# Patient Record
Sex: Male | Born: 1995 | Race: Black or African American | Hispanic: No | Marital: Single | Smoking: Never smoker
Health system: Southern US, Community
[De-identification: ages and names within clinical notes are randomized; demographics above are authoritative.]

## PROBLEM LIST (undated history)

## (undated) DIAGNOSIS — R0600 Dyspnea, unspecified: Secondary | ICD-10-CM

## (undated) DIAGNOSIS — R9431 Abnormal electrocardiogram [ECG] [EKG]: Secondary | ICD-10-CM

## (undated) DIAGNOSIS — R0609 Other forms of dyspnea: Secondary | ICD-10-CM

## (undated) DIAGNOSIS — R002 Palpitations: Secondary | ICD-10-CM

## (undated) DIAGNOSIS — R0789 Other chest pain: Secondary | ICD-10-CM

## (undated) HISTORY — DX: Other chest pain: R07.89

## (undated) HISTORY — PX: NO PAST SURGERIES: SHX2092

## (undated) HISTORY — DX: Dyspnea, unspecified: R06.00

## (undated) HISTORY — DX: Abnormal electrocardiogram (ECG) (EKG): R94.31

## (undated) HISTORY — DX: Other forms of dyspnea: R06.09

## (undated) HISTORY — DX: Palpitations: R00.2

---
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
## 2016-07-07 ENCOUNTER — Emergency Department (HOSPITAL_COMMUNITY)
Admission: EM | Admit: 2016-07-07 | Discharge: 2016-07-07 | Disposition: A | Discharge status: Home / Self Care | Payer: Managed Care, Other (non HMO) | Attending: Emergency Medicine | Admitting: Emergency Medicine

## 2016-07-07 ENCOUNTER — Encounter (HOSPITAL_COMMUNITY): Payer: Self-pay

## 2016-07-07 ENCOUNTER — Emergency Department (HOSPITAL_COMMUNITY): Payer: Managed Care, Other (non HMO)

## 2016-07-07 DIAGNOSIS — T7840XA Allergy, unspecified, initial encounter: Secondary | ICD-10-CM

## 2016-07-07 DIAGNOSIS — T781XXA Other adverse food reactions, not elsewhere classified, initial encounter: Secondary | ICD-10-CM | POA: Insufficient documentation

## 2016-07-07 DIAGNOSIS — R220 Localized swelling, mass and lump, head: Secondary | ICD-10-CM | POA: Diagnosis not present

## 2016-07-07 DIAGNOSIS — Z792 Long term (current) use of antibiotics: Secondary | ICD-10-CM | POA: Diagnosis not present

## 2016-07-07 LAB — CBC
HCT: 41.8 % (ref 39.0–52.0)
HEMOGLOBIN: 14 g/dL (ref 13.0–17.0)
MCH: 25.9 pg — ABNORMAL LOW (ref 26.0–34.0)
MCHC: 33.5 g/dL (ref 30.0–36.0)
MCV: 77.3 fL — ABNORMAL LOW (ref 78.0–100.0)
Platelets: 242 10*3/uL (ref 150–400)
RBC: 5.41 MIL/uL (ref 4.22–5.81)
RDW: 13.9 % (ref 11.5–15.5)
WBC: 7.7 10*3/uL (ref 4.0–10.5)

## 2016-07-07 LAB — BASIC METABOLIC PANEL
ANION GAP: 8 (ref 5–15)
BUN: 13 mg/dL (ref 6–20)
CALCIUM: 9.5 mg/dL (ref 8.9–10.3)
CO2: 24 mmol/L (ref 22–32)
Chloride: 108 mmol/L (ref 101–111)
Creatinine, Ser: 1.05 mg/dL (ref 0.61–1.24)
Glucose, Bld: 91 mg/dL (ref 65–99)
Potassium: 3.5 mmol/L (ref 3.5–5.1)
Sodium: 140 mmol/L (ref 135–145)

## 2016-07-07 LAB — I-STAT TROPONIN, ED: TROPONIN I, POC: 0 ng/mL (ref 0.00–0.08)

## 2016-07-07 MED ORDER — DIPHENHYDRAMINE HCL 50 MG/ML IJ SOLN
50.0000 mg | Freq: Once | INTRAMUSCULAR | Status: AC
  Administered 2016-07-07: 50 mg via INTRAVENOUS
  Filled 2016-07-07: qty 1

## 2016-07-07 MED ORDER — DIPHENHYDRAMINE HCL 25 MG PO TABS: 50.0000 mg | ORAL_TABLET | ORAL | 0 refills | Status: AC | PRN

## 2016-07-07 MED ORDER — PREDNISONE 20 MG PO TABS: ORAL_TABLET | ORAL | 0 refills | Status: DC

## 2016-07-07 MED ORDER — METHYLPREDNISOLONE SODIUM SUCC 125 MG IJ SOLR
125.0000 mg | Freq: Once | INTRAMUSCULAR | Status: AC
  Administered 2016-07-07: 125 mg via INTRAVENOUS
  Filled 2016-07-07: qty 2

## 2016-07-07 NOTE — ED Triage Notes (Addendum)
Pt states that he started having L sided chest pain tonight. Also endorses a burning pain in his throat. No SOB, but states that he did vomit on the way here. Pt endorses eat chinese food earlier today, but nothing new besides that. NKA. Pt not diaphoretic. Ambulatory.

## 2016-07-07 NOTE — ED Provider Notes (Signed)
[facility]-EMERGENCY DEPT Provider Note  By signing my name below, I, Jeffery Mendoza, attest that this documentation has been prepared under the direction and in the presence of Treatment Team:  Attending Provider: Gilda Crease, MD.  Electronically Signed: Arvilla Market, Medical Scribe. 07/07/16. 2:30 AM.  CSN: [id number] Arrival date & time: 07/07/16  0202  First Provider Contact:  None    History   Chief Complaint Chief Complaint  Patient presents with  . Chest Pain  . Allergic Reaction    HPI Jeffery Mendoza is a 20 y.o. male.  The history is provided by the patient. No language interpreter was used.  Chest Pain  Associated symptoms: chest pain, nausea and vomiting   Associated symptoms: no shortness of breath   Allergic Reaction  Presenting symptoms: difficulty swallowing   Presenting symptoms: no wheezing    HPI Comments: Jeffery Mendoza is a 20 y.o. male who presents to the Emergency Department complaining of throat swelling onset tonight.  Pt states it feels like his throat is half way closed. Pt was eating chineses food earlier tonight. Pt felt like something went down his throat when he was drinking water. He states that he is having associated symptoms of chest pain, nausea, and vomiting.  Pt reports dark emesis after his throat felt different. Pt denies toruble breathing, and having food allergies.  History reviewed. No pertinent past medical history.  There are no active problems to display for this patient.   History reviewed. No pertinent surgical history.     Home Medications    Prior to Admission medications   Not on File    Family History History reviewed. No pertinent family history.  Social History Social History  Substance Use Topics  . Smoking status: Never Smoker  . Smokeless tobacco: Not on file  . Alcohol use No     Allergies   Review of patient's allergies indicates no known allergies.   Review of Systems Review of Systems    HENT: Positive for trouble swallowing.   Respiratory: Negative for shortness of breath and wheezing.   Cardiovascular: Positive for chest pain.  Gastrointestinal: Positive for nausea and vomiting.  Allergic/Immunologic: Negative for food allergies.   Physical Exam Updated Vital Signs BP 149/99 (BP Location: Left Arm)   Pulse 76   Temp 98.9 F (37.2 C) (Oral)   Resp 20   SpO2 99%   Physical Exam  Constitutional: He is oriented to person, place, and time. He appears well-developed and well-nourished. No distress.  HENT:  Head: Normocephalic and atraumatic.  Right Ear: Hearing normal.  Left Ear: Hearing normal.  Nose: Nose normal.  Mouth/Throat: Oropharynx is clear and moist and mucous membranes are normal.  Eyes: Conjunctivae and EOM are normal. Pupils are equal, round, and reactive to light.  Neck: Normal range of motion. Neck supple.  Cardiovascular: Regular rhythm, S1 normal and S2 normal.  Exam reveals no gallop and no friction rub.   No murmur heard. Pulmonary/Chest: Effort normal and breath sounds normal. No respiratory distress. He exhibits no tenderness.  Abdominal: Soft. Normal appearance and bowel sounds are normal. There is no hepatosplenomegaly. There is no tenderness. There is no rebound, no guarding, no tenderness at McBurney's point and negative Murphy's sign. No hernia.  Musculoskeletal: Normal range of motion.  Neurological: He is alert and oriented to person, place, and time. He has normal strength. No cranial nerve deficit or sensory deficit. Coordination normal. GCS eye subscore is 4. GCS verbal subscore is 5. GCS motor  subscore is 6.  Skin: Skin is warm, dry and intact. No rash noted. No cyanosis.  Psychiatric: He has a normal mood and affect. His speech is normal and behavior is normal. Thought content normal.  Nursing note and vitals reviewed.  ED Treatments / Results  Labs (all labs ordered are listed, but only abnormal results are displayed) Labs  Reviewed  CBC - Abnormal; Notable for the following:       Result Value   MCV 77.3 (*)    MCH 25.9 (*)    All other components within normal limits  BASIC METABOLIC PANEL  I-STAT TROPOININ, ED   DIAGNOSTIC STUDIES: Oxygen Saturation is 99% on RA, nl by my interpretation.    COORDINATION OF CARE: 2:35 AM Discussed treatment plan with pt at bedside and pt agreed to plan.   EKG  EKG Interpretation None       Radiology Dg Chest 2 View  Result Date: 07/07/2016 CLINICAL DATA:  Pt states that he started having L sided chest pain tonight. Also endorses a burning pain in his throat. No SOB, but states that he did vomit on the way here. EXAM: CHEST  2 VIEW COMPARISON:  None. FINDINGS: The heart size and mediastinal contours are within normal limits. Both lungs are clear. No pleural effusion or pneumothorax. The visualized skeletal structures are unremarkable. IMPRESSION: Normal chest radiographs. Electronically Signed   By: Amie Portland M.D.   On: 07/07/2016 03:21  Procedures Procedures (including critical care time)  Medications Ordered in ED Medications - No data to display   Initial Impression / Assessment and Plan / ED Course  I have reviewed the triage vital signs and the nursing notes.  Pertinent labs & imaging results that were available during my care of the patient were reviewed by me and considered in my medical decision making (see chart for details).  Clinical Course    Patient presents to the emergency department with complaints of feeling like his throat was closing up. This was followed by onset of chest discomfort and then he had nausea and vomiting. Chest pain has resolved. He is not expressing shortness of breath. Patient still feels like he is having difficulty swallowing. Oropharyngeal examination, however was normal. No sign of significant swelling. He does not have any rash or urticaria, but allergic reaction is considered. Patient administered IV fluids,  Benadryl, Solu-Medrol and monitored. Symptoms have not progressed, patient still resting comfortably. Patient appropriate for discharge, follow-up as needed. Will treat with prednisone taper, Benadryl.  Final Clinical Impressions(s) / ED Diagnoses   Final diagnoses:  None  allergic reaction  New Prescriptions New Prescriptions   No medications on file    I personally performed the services described in this documentation, which was scribed in my presence. The recorded information has been reviewed and is accurate.    Gilda Crease, MD 07/07/16 [phone]

## 2016-07-31 ENCOUNTER — Other Ambulatory Visit: Payer: Self-pay | Admitting: Cardiology

## 2016-07-31 DIAGNOSIS — R9431 Abnormal electrocardiogram [ECG] [EKG]: Secondary | ICD-10-CM

## 2016-07-31 DIAGNOSIS — R0602 Shortness of breath: Secondary | ICD-10-CM

## 2016-07-31 DIAGNOSIS — R931 Abnormal findings on diagnostic imaging of heart and coronary circulation: Secondary | ICD-10-CM

## 2016-07-31 DIAGNOSIS — R0789 Other chest pain: Secondary | ICD-10-CM

## 2016-08-01 ENCOUNTER — Telehealth: Payer: Self-pay | Admitting: Cardiology

## 2016-08-01 NOTE — Telephone Encounter (Signed)
Called the patient and gave him date, time and location of cardiac MRI.  Told him to be there at 8:30 for labs before MRI.

## 2016-08-05 ENCOUNTER — Ambulatory Visit (HOSPITAL_COMMUNITY)
Admission: RE | Admit: 2016-08-05 | Discharge: 2016-08-05 | Disposition: A | Discharge status: Home / Self Care | Payer: Managed Care, Other (non HMO) | Source: Ambulatory Visit | Attending: Cardiology | Admitting: Cardiology

## 2016-08-05 DIAGNOSIS — R0602 Shortness of breath: Secondary | ICD-10-CM | POA: Diagnosis not present

## 2016-08-05 DIAGNOSIS — I517 Cardiomegaly: Secondary | ICD-10-CM | POA: Insufficient documentation

## 2016-08-05 DIAGNOSIS — I340 Nonrheumatic mitral (valve) insufficiency: Secondary | ICD-10-CM | POA: Diagnosis not present

## 2016-08-05 DIAGNOSIS — R0789 Other chest pain: Secondary | ICD-10-CM

## 2016-08-05 DIAGNOSIS — I071 Rheumatic tricuspid insufficiency: Secondary | ICD-10-CM | POA: Insufficient documentation

## 2016-08-05 DIAGNOSIS — R9431 Abnormal electrocardiogram [ECG] [EKG]: Secondary | ICD-10-CM | POA: Insufficient documentation

## 2016-08-05 DIAGNOSIS — R931 Abnormal findings on diagnostic imaging of heart and coronary circulation: Secondary | ICD-10-CM | POA: Diagnosis not present

## 2016-08-05 DIAGNOSIS — I371 Nonrheumatic pulmonary valve insufficiency: Secondary | ICD-10-CM | POA: Insufficient documentation

## 2016-08-05 MED ORDER — GADOBENATE DIMEGLUMINE 529 MG/ML IV SOLN
25.0000 mL | Freq: Once | INTRAVENOUS | Status: AC
  Administered 2016-08-05: 23 mL via INTRAVENOUS

## 2016-08-21 ENCOUNTER — Ambulatory Visit
Admission: RE | Admit: 2016-08-21 | Discharge: 2016-08-21 | Disposition: A | Discharge status: Home / Self Care | Payer: Managed Care, Other (non HMO) | Source: Ambulatory Visit | Attending: Cardiology | Admitting: Cardiology

## 2016-08-21 ENCOUNTER — Other Ambulatory Visit: Payer: Self-pay | Admitting: Cardiology

## 2016-08-21 DIAGNOSIS — R0600 Dyspnea, unspecified: Secondary | ICD-10-CM

## 2016-09-11 ENCOUNTER — Encounter: Payer: Self-pay | Admitting: *Deleted

## 2016-09-12 ENCOUNTER — Ambulatory Visit (INDEPENDENT_AMBULATORY_CARE_PROVIDER_SITE_OTHER): Payer: Managed Care, Other (non HMO) | Admitting: Pulmonary Disease

## 2016-09-12 ENCOUNTER — Encounter: Payer: Self-pay | Admitting: Pulmonary Disease

## 2016-09-12 ENCOUNTER — Other Ambulatory Visit (INDEPENDENT_AMBULATORY_CARE_PROVIDER_SITE_OTHER): Payer: Managed Care, Other (non HMO)

## 2016-09-12 VITALS — BP 124/70 | HR 73 | Ht 76.0 in | Wt 158.6 lb

## 2016-09-12 DIAGNOSIS — R0600 Dyspnea, unspecified: Secondary | ICD-10-CM

## 2016-09-12 LAB — CBC WITH DIFFERENTIAL/PLATELET
BASOS ABS: 0 10*3/uL (ref 0.0–0.1)
BASOS PCT: 0.4 % (ref 0.0–3.0)
EOS PCT: 0.6 % (ref 0.0–5.0)
Eosinophils Absolute: 0 10*3/uL (ref 0.0–0.7)
HCT: 43.9 % (ref 39.0–52.0)
Hemoglobin: 14.4 g/dL (ref 13.0–17.0)
LYMPHS ABS: 1.7 10*3/uL (ref 0.7–4.0)
Lymphocytes Relative: 22.8 % (ref 12.0–46.0)
MCHC: 32.7 g/dL (ref 30.0–36.0)
MCV: 78.5 fl (ref 78.0–100.0)
MONO ABS: 0.5 10*3/uL (ref 0.1–1.0)
MONOS PCT: 6.7 % (ref 3.0–12.0)
NEUTROS PCT: 69.5 % (ref 43.0–77.0)
Neutro Abs: 5.1 10*3/uL (ref 1.4–7.7)
PLATELETS: 298 10*3/uL (ref 150.0–400.0)
RBC: 5.59 Mil/uL (ref 4.22–5.81)
RDW: 15.4 % (ref 11.5–15.5)
WBC: 7.3 10*3/uL (ref 4.0–10.5)

## 2016-09-12 LAB — NITRIC OXIDE: Nitric Oxide: 7

## 2016-09-12 MED ORDER — AZELASTINE-FLUTICASONE 137-50 MCG/ACT NA SUSP: 1.0000 | Freq: Two times a day (BID) | NASAL | 6 refills | Status: AC

## 2016-09-12 NOTE — Patient Instructions (Signed)
We will schedule for methacholine challenge test, pulmonary function tests, CBC with differential, blood allergy profile. Continue using the albuterol inhaler  We will start chlorpheniramine 8mg  three times daily and dymista nasal spray twice daily.

## 2016-09-12 NOTE — Progress Notes (Signed)
Jeffery Mendoza    [id number]    May 09, 1996  Primary Care Physician:No primary care provider on file.  Referring Physician: Yates Decamp, MD [address] [place], [place] [address]  Chief complaint:  Consult for evaluation of dyspnea  HPI: [name] is a 20 year old with no significant past medical history. He had an episode of dyspnea in July 2017. This was associated with throat tightness, dizziness, vomiting, chest pain. He was evaluated in the ED and ruled out for an acute coronary syndrome. He later followed up with Jeffery Mendoza, Cardiology with a workup that included an echocardiogram, cardiac MRI and event monitor. No significant cardiac abnormalities were identified.  His chief complaints are episodes of chest pain, tightness associated with dyspnea. There are no particular aggravating or relieving factors and occurs both at rest and during exertion. He does not report any sensitivities to strong perfumes, heat, humidity, cold air, cats, dogs. He works at the [employer], sport and exercise at [employer] and with no known exposures at work or at home. He is a never smoker, occasional alcohol use. He has an albuterol inhaler that he uses occasionally with moderate relief in symptoms  Outpatient Encounter Prescriptions as of 09/12/2016  Medication Sig  . diphenhydrAMINE (BENADRYL) 25 MG tablet Take 2 tablets (50 mg total) by mouth every 4 (four) hours as needed for itching.  Marland Kitchen EPIPEN 2-PAK 0.3 MG/0.3ML SOAJ injection Use as needed for anaphylactic reaction  . VENTOLIN HFA 108 (90 Base) MCG/ACT inhaler Inhale 2 puffs into the lungs every 6 (six) hours as needed.  . [DISCONTINUED] doxycycline (VIBRAMYCIN) 100 MG capsule Take 100 mg by mouth 2 (two) times daily.  . [DISCONTINUED] predniSONE (DELTASONE) 20 MG tablet 3 tabs po daily x 3 days, then 2 tabs x 3 days, then 1.5 tabs x 3 days, then 1 tab x 3 days, then 0.5 tabs x 3 days (Patient not taking: Reported on 09/12/2016)   No  facility-administered encounter medications on file as of 09/12/2016.     Allergies as of 09/12/2016  . (No Known Allergies)    Past Medical History:  Diagnosis Date  . Abnormal EKG    cardiac MRI 08/05/2016: normal cardiac MR with mild LVH, LVEF 60%, trace MR and TR.  normal aortic root and ascending aorta.  milkdly dilated IVC with respiratory variation  . Atypical chest pain   . Dyspnea on exertion    echocardiogram 07/18/2016: left ventricle cavity is normal in size.  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  there is possible milkd speckled pattern apparents and may be a normal variant.  low normal LV systolic function.  visual EF is 50-55%.  calculated EF 50%.  structurally normal mitral valve with trace regurgitation.  structurally normal tricuspid valve with trace regurgitation.  . Palpitations     No past surgical history on file.  Family History  Problem Relation Age of Onset  . Adopted: Yes    Social History   Social History  . Marital status: Single    Spouse name: N/A  . Number of children: N/A  . Years of education: N/A   Occupational History  . Not on file.   Social History Main Topics  . Smoking status: Never Smoker  . Smokeless tobacco: Never Used  . Alcohol use Yes     Comment: liquor, twice a week  . Drug use:     Types: Marijuana  . Sexual activity: Not Currently  Other Topics Concern  . Not on file   Social History Narrative   Single, lives at home   No children   OCCUPATION: works at [employer], front desk   [place]   Review of systems: Review of Systems  Constitutional: Negative for fever and chills.  HENT: Negative.   Eyes: Negative for blurred vision.  Respiratory: as per HPI  Cardiovascular: Negative for chest pain and palpitations.  Gastrointestinal: Negative for vomiting, diarrhea, blood per rectum. Genitourinary: Negative for dysuria, urgency, frequency and hematuria.  Musculoskeletal:  Negative for myalgias, back pain and joint pain.  Skin: Negative for itching and rash.  Neurological: Negative for dizziness, tremors, focal weakness, seizures and loss of consciousness.  Endo/Heme/Allergies: Negative for environmental allergies.  Psychiatric/Behavioral: Negative for depression, suicidal ideas and hallucinations.  All other systems reviewed and are negative.  Physical Exam: Blood pressure 124/70, pulse 73, height 6\' 4"  (1.93 m), weight 158 lb 9.6 oz (71.9 kg), SpO2 98 %. Gen:      No acute distress HEENT:  EOMI, sclera anicteric Neck:     No masses; no thyromegaly Lungs:    Clear to auscultation bilaterally; normal respiratory effort CV:         Regular rate and rhythm; no murmurs Abd:      + bowel sounds; soft, non-tender; no palpable masses, no distension Ext:    No edema; adequate peripheral perfusion Skin:      Warm and dry; no rash Neuro: alert and oriented x 3 Psych: normal mood and affect  Data Reviewed: Echo 07/18/16 Normal LV cavity mild LVH, normal systolic function EF 50-55 percent.  Cardiac MRI 08/05/16 Normal cardiac MRI with mild LVH, LVEF 60%, trace MR and TR.  Chest x-ray 08/21/16 Minimal hyperinflation, no infiltrates. Images reviewed.  Assessment:  Evaluation for episodic dyspnea.  His symptoms are atypical and not very suggestive of asthma, reactive airway disease. He does have some relief when he uses the albuterol before periods of activity. He'll continue the same. I'll further evaluate with a methacholine challenge test and pulmonary function tests. I'll check an FENO to assess airway inflammation and a CBC differential, blood allergy profile.  He does have symptoms of postnasal drip, nonproductive cough which I'll treat with the antihistamine chlorpheniramine and dymista nasal spray.  Plan/Recommendations: - PFTs, FENO, CBC with diff, blood allergy profile - Schedule PFTs and methacholine challenge - Continue albuterol rescue inhaler -  Start chlorpheniramine 8 mg tid and dymista nasal spray.   Chilton GreathousePraveen Jadwiga Faidley MD [facility] Pager [phone] 09/12/2016, 12:11 PM  CC: Jeffery DecampGanji, Jay, MD

## 2016-09-13 ENCOUNTER — Telehealth: Payer: Self-pay | Admitting: Pulmonary Disease

## 2016-09-13 LAB — RESPIRATORY ALLERGY PROFILE REGION II ~~LOC~~
Allergen, A. alternata, m6: <0.1 kU/L
Allergen, C. Herbarum, M2: <0.1 kU/L
Allergen, Cedar tree, t12: <0.1 kU/L
Allergen, Comm Silver Birch, t9: <0.1 kU/L
Allergen, Cottonwood, t14: <0.1 kU/L
Allergen, Mulberry, t76: <0.1 kU/L
Allergen, P. notatum, m1: <0.1 kU/L
Aspergillus fumigatus, m3: <0.1 kU/L
Bermuda Grass: <0.1 kU/L
Box Elder IgE: <0.1 kU/L
Cockroach: <0.1 kU/L
Common Ragweed: <0.1 kU/L
IgE (Immunoglobulin E), Serum: 7 kU/L (ref <115)
PECAN/HICKORY TREE IGE: 1.55 kU/L — AB
Rough Pigweed  IgE: <0.1 kU/L
TIMOTHY GRASS: 0.35 kU/L — AB

## 2016-09-13 NOTE — Telephone Encounter (Signed)
lmtcb x1 

## 2016-09-13 NOTE — Telephone Encounter (Signed)
Patient returning call -pr °

## 2016-09-16 MED ORDER — CHLORPHENIRAMINE MALEATE 4 MG PO TABS: 4.0000 mg | ORAL_TABLET | Freq: Two times a day (BID) | ORAL | 3 refills | Status: AC | PRN

## 2016-09-16 NOTE — Telephone Encounter (Signed)
Spoke with pt and he was under the impression that he would have 2 medications to pick up at pharmacy. Pt only got Dymista. Advised pt that chlorpheniramine was OTC and he could get that at the pharmacy. Pt asked for this to be sent in to pharmacy in case it was cheaper with his insurance. Rx sent. Pt also asked about lab results, I advised that once we have those we will call him. Nothing further needed.

## 2016-09-19 ENCOUNTER — Ambulatory Visit (INDEPENDENT_AMBULATORY_CARE_PROVIDER_SITE_OTHER): Payer: Managed Care, Other (non HMO) | Admitting: Allergy

## 2016-09-19 ENCOUNTER — Encounter: Payer: Self-pay | Admitting: Allergy

## 2016-09-19 ENCOUNTER — Telehealth: Payer: Self-pay | Admitting: Pulmonary Disease

## 2016-09-19 VITALS — BP 110/64 | HR 80 | Temp 98.7°F | Resp 16 | Ht 74.0 in | Wt 159.2 lb

## 2016-09-19 DIAGNOSIS — K219 Gastro-esophageal reflux disease without esophagitis: Secondary | ICD-10-CM | POA: Insufficient documentation

## 2016-09-19 DIAGNOSIS — T7840XA Allergy, unspecified, initial encounter: Secondary | ICD-10-CM | POA: Diagnosis not present

## 2016-09-19 DIAGNOSIS — J301 Allergic rhinitis due to pollen: Secondary | ICD-10-CM | POA: Insufficient documentation

## 2016-09-19 LAB — COMPREHENSIVE METABOLIC PANEL
ALBUMIN: 4.4 g/dL (ref 3.6–5.1)
ALT: 8 U/L — AB (ref 9–46)
AST: 16 U/L (ref 10–40)
Alkaline Phosphatase: 47 U/L (ref 40–115)
BILIRUBIN TOTAL: 1 mg/dL (ref 0.2–1.2)
BUN: 10 mg/dL (ref 7–25)
CALCIUM: 10.1 mg/dL (ref 8.6–10.3)
CHLORIDE: 104 mmol/L (ref 98–110)
CO2: 26 mmol/L (ref 20–31)
CREATININE: 0.87 mg/dL (ref 0.60–1.35)
GLUCOSE: 64 mg/dL — AB (ref 65–99)
Potassium: 5 mmol/L (ref 3.5–5.3)
Sodium: 142 mmol/L (ref 135–146)
TOTAL PROTEIN: 6.7 g/dL (ref 6.1–8.1)

## 2016-09-19 MED ORDER — RABEPRAZOLE SODIUM 20 MG PO TBEC: 20.0000 mg | DELAYED_RELEASE_TABLET | Freq: Every day | ORAL | 3 refills | Status: AC

## 2016-09-19 NOTE — Telephone Encounter (Signed)
Labs show mild allergies to grass and tree pollen. We are still waiting for PFTs and methacholine challenge test. Are they scheduled yet?

## 2016-09-19 NOTE — Telephone Encounter (Signed)
Spoke with pt, requesting lab results from last week.  PM please advise.  Thanks!

## 2016-09-19 NOTE — Progress Notes (Signed)
New Patient Note  RE: Jeffery Mendoza MRN: [id number] DOB: 08/26/1996 Date of Office Visit: 09/19/2016  Referring provider: Jesse FallMauney, Jessica S, PA-C Primary care provider: Marcelo BaldyMAUNEY, JESSICA S, PA-C  Chief Complaint: Allergic reaction  History of present illness: Jeffery Mendoza is a 20 y.o. male presenting today for consultation for allergic reaction.   He went to the ED at the end of July for an allergic reaction.  He says throughout the day he was eating [place] food.  He recalls having pork egg roll around 1-2 PM at work.  Later in the day he had sesame chicken.  Toward the end of the night after he got off work he started to feel that his  throat felt dry and he drank some water but felt that his throat was closing up.  He noticed that his heart was racing and he felt weak.  His brother drove him to the Ed. On the way to the ED he reported a large volume emesis.  He felt like he was also having some difficulty breathing.  No itching or rash.  Outside of the [place] food that he had had that day he doesn't identify any other triggers. He states that he has had this [place] food meal on many occasions without any issues.  He denies any preceding illnesses, new medications, no new foods, changes in soaps or lotions or detergents.   In the ED he was noted to have a slightly elevated blood pressure with normal remaining vital signs. His physical exam was unremarkable with no evidence of oral pharyngeal edema, respiratory distress.  He was given IV fluids, Benadryl, Solu-Medrol and he was monitored. He was discharged home to complete a prednisone taper and Benadryl.  He has not had any further reaction since this initial one. He does endorse or burn symptoms and the sour brash in the back of his throat. He does report since his ED visit that he has had some more chest tightness and shortness of breath for which she saw Dr. Isaiah SergeMannam with our pulmonary. For his note isn't symptoms are atypical and not very  suggestive of asthma. He did obtain a blood allergy profile that showed sensitivity to grass and tree. He will be having PFTs and methacholine challenge done. He was started on Dymista nasal spray and antihistamine. He also has been evaluated by cardiology.    Review of systems: Review of Systems  Constitutional: Negative for chills and fever.  HENT: Positive for congestion. Negative for sore throat.   Eyes: Positive for redness.  Respiratory: Positive for shortness of breath. Negative for cough and wheezing.   Cardiovascular: Positive for palpitations.  Gastrointestinal: Negative for nausea and vomiting.  Skin: Negative for itching and rash.  Neurological: Negative for headaches.    All other systems negative unless noted above in HPI  Past medical history: Past Medical History:  Diagnosis Date  . Abnormal EKG    cardiac MRI 08/05/2016: normal cardiac MR with mild LVH, LVEF 60%, trace MR and TR.  normal aortic root and ascending aorta.  milkdly dilated IVC with respiratory variation  . Atypical chest pain   . Dyspnea on exertion    echocardiogram 07/18/2016: left ventricle cavity is normal in size.  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  there is possible milkd speckled pattern apparents and may be a normal variant.  low normal LV systolic function.  visual EF is 50-55%.  calculated EF 50%.  structurally normal mitral valve with trace regurgitation.  structurally normal tricuspid valve with trace regurgitation.  . Palpitations     Past surgical history: Past Surgical History:  Procedure Laterality Date  . NO PAST SURGERIES      Family history:  Family History  Problem Relation Age of Onset  . Adopted: Yes    Social history:  Social History Narrative   Single, lives at home With carpeting in the bedroom and electric heating and central cooling. There are no pets in the home.    No children   OCCUPATION: works at [employer], front desk   TRAVEL: [place] x3 days  October 2017    Medication List:   Medication List       Accurate as of 09/19/16  7:04 PM. Always use your most recent med list.          Azelastine-Fluticasone 137-50 MCG/ACT Susp Commonly known as:  DYMISTA Place 1 spray into the nose 2 (two) times daily.   chlorpheniramine 4 MG tablet Commonly known as:  CHLORPHEN Take 1 tablet (4 mg total) by mouth 2 (two) times daily as needed for allergies.   diphenhydrAMINE 25 MG tablet Commonly known as:  BENADRYL Take 2 tablets (50 mg total) by mouth every 4 (four) hours as needed for itching.   EPIPEN 2-PAK 0.3 mg/0.3 mL Soaj injection Generic drug:  EPINEPHrine Use as needed for anaphylactic reaction   RABEprazole 20 MG tablet Commonly known as:  ACIPHEX Take 1 tablet (20 mg total) by mouth daily.   VENTOLIN HFA 108 (90 Base) MCG/ACT inhaler Generic drug:  albuterol Inhale 2 puffs into the lungs every 6 (six) hours as needed.       Known medication allergies: No Known Allergies   Physical examination: Blood pressure 110/64, pulse 80, temperature 98.7 F (37.1 C), temperature source Oral, resp. rate 16, height 6\' 2"  (1.88 m), weight 159 lb 3.2 oz (72.2 kg), SpO2 97 %.  General: Alert, interactive, in no acute distress. HEENT: TMs pearly gray, turbinates mildly edematous without discharge, post-pharynx non erythematous. Neck: Supple without lymphadenopathy. Lungs: Clear to auscultation without wheezing, rhonchi or rales. {no increased work of breathing. CV: Normal S1, S2 without murmurs. Abdomen: Nondistended, nontender. Skin: Warm and dry, without lesions or rashes. Extremities:  No clubbing, cyanosis or edema. Neuro:   Grossly intact.  Diagnositics/Labs: Labs:  Component     Latest Ref Rng & Units 09/12/2016  Allergen, D pternoyssinus,d7     kU/L <0.10  D. farinae     kU/L <0.10  Cat Dander     kU/L <0.10  Dog Dander     kU/L <0.10  [place] Grass     kU/L <0.10  Timothy Grass     kU/L 0.35 (H)    [place] Grass     kU/L <0.10  Cockroach     kU/L <0.10  Allergen, P. notatum, m1     kU/L <0.10  Allergen, C. Herbarum, M2     kU/L <0.10  Aspergillus fumigatus, m3     kU/L <0.10  Allergen, A. alternata, m6     kU/L <0.10  Box Elder IgE     kU/L <0.10  Allergen, Comm Silver Charletta Cousin, t9     kU/L <0.10  Allergen, Cedar tree, t12     kU/L <0.10  Allergen, Oak,t7     kU/L <0.10  Elm IgE     kU/L <0.10  Allergen, Cottonwood, t14     kU/L <0.10  Pecan/Hickory Tree IgE     kU/L 1.55 (H)  Allergen, Mulberry, t76     kU/L <0.10  Common Ragweed     kU/L <0.10  Rough Pigweed  IgE     kU/L <0.10  Sheep Sorrel IgE     kU/L <0.10  IgE (Immunoglobulin E), Serum     <115 kU/L 7  Allergen, Mouse Urine Protein, e78     kU/L <0.10   Component     Latest Ref Rng & Units 09/12/2016  WBC     4.0 - 10.5 K/uL 7.3  RBC     4.22 - 5.81 Mil/uL 5.59  Hemoglobin     13.0 - 17.0 g/dL 16.1  HCT     09.6 - 04.5 % 43.9  MCV     78.0 - 100.0 fl 78.5  MCHC     30.0 - 36.0 g/dL 40.9  RDW     81.1 - 91.4 % 15.4  Platelets     150.0 - 400.0 K/uL 298.0  Neutrophils     43.0 - 77.0 % 69.5  Lymphocytes     12.0 - 46.0 % 22.8  Monocytes Relative     3.0 - 12.0 % 6.7  Eosinophil     0.0 - 5.0 % 0.6  Basophil     0.0 - 3.0 % 0.4  NEUT#     1.4 - 7.7 K/uL 5.1  Lymphocyte #     0.7 - 4.0 K/uL 1.7  Monocyte #     0.1 - 1.0 K/uL 0.5  Eosinophils Absolute     0.0 - 0.7 K/uL 0.0  Basophils Absolute     0.0 - 0.1 K/uL 0.0   Assessment and plan:   Allergic reaction    - At this time no clear identifiable trigger to his allergic reaction. His symptoms involving more than one organ system appears consistent with allergic reaction. Given that he had had work earlier in the day prior to the onset of his reaction concern for possible alpha gal allergy.    - will obtain CMP, tryptase level, alpha gal panel, chicken IgE    - Have access to your EpiPen at all times    -If you have any further  reaction please note any foods, drinks, medications, activities you were doing within 4-6 hours of the reaction    Allergic rhinitis  - Serum IgE levels indicate a tree and grass allergy  - Recommend taking an antihistamine like Allegra, Zyrtec, Xyzal as needed for nasal congestion and drainage  -May continue use of Dymista 2 sprays in each nostril up to twice a day  Reflux  - Trial AcipHex 20 mg daily  Follow-up in 3-4 months  I appreciate the opportunity to take part in [place] care. Please do not hesitate to contact me with questions.  Sincerely,   Margo Aye, MD Allergy/Immunology [facility]

## 2016-09-19 NOTE — Patient Instructions (Signed)
Allergic reaction    - will obtain CMP, tryptase level, alpha gal panel, chicken IgE    - Have access to your EpiPen at all times    -If you have any further reaction please note any foods, drinks, medications, activities you were doing within 4-6 hours of the reaction    Allergic rhinitis  - Your laboratory indicated a tree and grass allergy  - Recommend taking an antihistamine like Allegra, Zyrtec, Xyzal when you are symptomatic  -May continue use of tenderness to 2 sprays in each nostril up to twice a day  Reflux  - Trial AcipHex 20 mg daily  Follow-up in 3-4 months

## 2016-09-20 LAB — TRYPTASE: Tryptase: 3.8 ug/L (ref <11)

## 2016-09-20 LAB — ALLERGEN, CHICKEN F83

## 2016-09-20 NOTE — Telephone Encounter (Signed)
These have been scheduled

## 2016-09-23 NOTE — Telephone Encounter (Signed)
Called and spoke with pt and he is aware of lab results.  Nothing further is needed.

## 2016-09-24 LAB — ALPHA-GAL PANEL
CLASS: 0
CLASS: 0
Class: 0
Lamb/Mutton IgE: <0.1 kU/L (ref <0.35)

## 2016-09-25 ENCOUNTER — Ambulatory Visit (HOSPITAL_COMMUNITY)
Admission: RE | Admit: 2016-09-25 | Discharge: 2016-09-25 | Disposition: A | Discharge status: Home / Self Care | Payer: Managed Care, Other (non HMO) | Source: Ambulatory Visit | Attending: Pulmonary Disease | Admitting: Pulmonary Disease

## 2016-09-25 DIAGNOSIS — R0600 Dyspnea, unspecified: Secondary | ICD-10-CM | POA: Diagnosis present

## 2016-09-25 LAB — PULMONARY FUNCTION TEST
FEF 25-75 Post: 2.44 L/sec
FEF 25-75 Pre: 3.66 L/sec
FEF2575-%Change-Post: -33 %
FEF2575-%PRED-PRE: 71 %
FEF2575-%Pred-Post: 47 %
FEV1-%Change-Post: -12 %
FEV1-%PRED-PRE: 77 %
FEV1-%Pred-Post: 68 %
FEV1-POST: 3.26 L
FEV1-PRE: 3.71 L
FEV1FVC-%CHANGE-POST: -5 %
FEV1FVC-%Pred-Pre: 97 %
FEV6-%CHANGE-POST: -6 %
FEV6-%PRED-POST: 73 %
FEV6-%PRED-PRE: 79 %
FEV6-POST: 4.15 L
FEV6-PRE: 4.46 L
FEV6FVC-%PRED-POST: 101 %
FEV6FVC-%PRED-PRE: 101 %
FVC-%Change-Post: -6 %
FVC-%PRED-POST: 73 %
FVC-%Pred-Pre: 78 %
FVC-POST: 4.15 L
FVC-Pre: 4.46 L
POST FEV6/FVC RATIO: 100 %
PRE FEV1/FVC RATIO: 83 %
Post FEV1/FVC ratio: 78 %
Pre FEV6/FVC Ratio: 100 %

## 2016-09-25 MED ORDER — METHACHOLINE 0.25 MG/ML NEB SOLN
2.0000 mL | Freq: Once | RESPIRATORY_TRACT | Status: AC
  Administered 2016-09-25: 0.5 mg via RESPIRATORY_TRACT

## 2016-09-25 MED ORDER — METHACHOLINE 16 MG/ML NEB SOLN
2.0000 mL | Freq: Once | RESPIRATORY_TRACT | Status: AC
  Administered 2016-09-25: 32 mg via RESPIRATORY_TRACT

## 2016-09-25 MED ORDER — METHACHOLINE 0.0625 MG/ML NEB SOLN
2.0000 mL | Freq: Once | RESPIRATORY_TRACT | Status: AC
  Administered 2016-09-25: 0.125 mg via RESPIRATORY_TRACT

## 2016-09-25 MED ORDER — ALBUTEROL SULFATE (2.5 MG/3ML) 0.083% IN NEBU
2.5000 mg | INHALATION_SOLUTION | Freq: Once | RESPIRATORY_TRACT | Status: AC
  Administered 2016-09-25: 2.5 mg via RESPIRATORY_TRACT

## 2016-09-25 MED ORDER — SODIUM CHLORIDE 0.9 % IN NEBU
3.0000 mL | INHALATION_SOLUTION | Freq: Once | RESPIRATORY_TRACT | Status: AC
  Administered 2016-09-25: 3 mL via RESPIRATORY_TRACT

## 2016-09-25 MED ORDER — METHACHOLINE 1 MG/ML NEB SOLN
2.0000 mL | Freq: Once | RESPIRATORY_TRACT | Status: AC
  Administered 2016-09-25: 2 mg via RESPIRATORY_TRACT

## 2016-09-25 MED ORDER — METHACHOLINE 4 MG/ML NEB SOLN
2.0000 mL | Freq: Once | RESPIRATORY_TRACT | Status: AC
  Administered 2016-09-25: 8 mg via RESPIRATORY_TRACT

## 2016-09-26 ENCOUNTER — Ambulatory Visit (HOSPITAL_COMMUNITY)
Admission: RE | Admit: 2016-09-26 | Discharge: 2016-09-26 | Disposition: A | Discharge status: Home / Self Care | Payer: Managed Care, Other (non HMO) | Source: Ambulatory Visit | Attending: Pulmonary Disease | Admitting: Pulmonary Disease

## 2016-09-26 DIAGNOSIS — R0600 Dyspnea, unspecified: Secondary | ICD-10-CM | POA: Diagnosis not present

## 2016-09-26 LAB — PULMONARY FUNCTION TEST
DL/VA % pred: 107 %
DL/VA: 5.4 ml/min/mmHg/L
DLCO unc % pred: 74 %
DLCO unc: 30.17 ml/min/mmHg
FEF 25-75 Post: 3.05 L/s
FEF 25-75 Pre: 2.9 L/s
FEF2575-%Change-Post: 5 %
FEF2575-%Pred-Post: 59 %
FEF2575-%Pred-Pre: 56 %
FEV1-%Change-Post: 1 %
FEV1-%Pred-Post: 68 %
FEV1-%Pred-Pre: 67 %
FEV1-Post: 3.26 L
FEV1-Pre: 3.22 L
FEV1FVC-%Change-Post: 3 %
FEV1FVC-%Pred-Pre: 91 %
FEV6-%Change-Post: -1 %
FEV6-%Pred-Post: 72 %
FEV6-%Pred-Pre: 73 %
FEV6-Post: 4.06 L
FEV6-Pre: 4.13 L
FEV6FVC-%Pred-Post: 101 %
FEV6FVC-%Pred-Pre: 101 %
FVC-%Change-Post: -1 %
FVC-%Pred-Post: 71 %
FVC-%Pred-Pre: 72 %
FVC-Post: 4.06 L
FVC-Pre: 4.13 L
Post FEV1/FVC ratio: 80 %
Post FEV6/FVC ratio: 100 %
Pre FEV1/FVC ratio: 78 %
Pre FEV6/FVC Ratio: 100 %
RV % pred: 123 %
RV: 2.18 L
TLC % pred: 79 %
TLC: 6.39 L

## 2016-09-26 MED ORDER — ALBUTEROL SULFATE (2.5 MG/3ML) 0.083% IN NEBU
2.5000 mg | INHALATION_SOLUTION | Freq: Once | RESPIRATORY_TRACT | Status: AC
  Administered 2016-09-26: 2.5 mg via RESPIRATORY_TRACT

## 2016-10-01 ENCOUNTER — Encounter: Payer: Self-pay | Admitting: Pulmonary Disease

## 2016-10-02 ENCOUNTER — Telehealth: Payer: Self-pay | Admitting: Allergy

## 2016-10-02 NOTE — Telephone Encounter (Signed)
He called asking for his lab results. Can you please call and review them with him?

## 2016-10-02 NOTE — Telephone Encounter (Signed)
Spoke to patient and informed him of his lab results.

## 2016-10-09 ENCOUNTER — Telehealth: Payer: Self-pay | Admitting: Pulmonary Disease

## 2016-10-09 DIAGNOSIS — R0600 Dyspnea, unspecified: Secondary | ICD-10-CM

## 2016-10-09 NOTE — Telephone Encounter (Signed)
Spoke with pt. He is requesting results from his PFT he had done on 09/26/16.  PM - please advise. Thanks.

## 2016-10-10 NOTE — Telephone Encounter (Signed)
[phone] pt calling back wants results

## 2016-10-10 NOTE — Telephone Encounter (Signed)
Called spoke with patient, discussed CY's result with him.   Pt would like recommendations from PM on Monday Pt also had his methacholine challenge on 10.18.17 and would like the results of this well Would also like to know if acid reflux would explain his symptoms and the results of the PFT  Dr Isaiah SergeMannam please advise, thank you.

## 2016-10-10 NOTE — Telephone Encounter (Signed)
Pt is getting anxious about receiving PFT results PM not available until Monday Dr Maple HudsonYoung, would you be able to view these results for the patient?  Thank you so much!

## 2016-10-10 NOTE — Telephone Encounter (Signed)
PFT showed mild to moderate slowing of airflow- what we call "obstruction". The size of the lungs is just a little less than expected, and oxygen exchange is very slightly reduced. None of this is in a severe range. It will be up to Dr Isaiah SergeMannam decide how to manage this.

## 2016-10-11 NOTE — Telephone Encounter (Signed)
Pt scheduled for 11/15 with PM. Ct ordered.   Nothing further needed.

## 2016-10-11 NOTE — Telephone Encounter (Signed)
I called and discussed test results with Mr Jeffery Mendoza. PFTs show minimal obstruction, restriction and DLCO impairment. Methacholine challenge test is negative. CXR reviewed with no cardiopulmomary abnormalities.Since he still has symptoms of dyspnea we will get a hig res CT for further evaluation. Please make an appointment in office to see me after CT for further discussion.

## 2016-10-18 ENCOUNTER — Ambulatory Visit (INDEPENDENT_AMBULATORY_CARE_PROVIDER_SITE_OTHER)
Admission: RE | Admit: 2016-10-18 | Discharge: 2016-10-18 | Disposition: A | Discharge status: Home / Self Care | Payer: Managed Care, Other (non HMO) | Source: Ambulatory Visit | Attending: Pulmonary Disease | Admitting: Pulmonary Disease

## 2016-10-18 DIAGNOSIS — R0600 Dyspnea, unspecified: Secondary | ICD-10-CM | POA: Diagnosis not present

## 2016-10-22 ENCOUNTER — Telehealth: Payer: Self-pay | Admitting: Pulmonary Disease

## 2016-10-22 NOTE — Telephone Encounter (Signed)
Spoke with the pt  He is asking for ct chest results  I advised that he should keep appt tomorrow so this can be discussed then  He states that he does not want to come in unless absolutely necessary  Please advise thanks

## 2016-10-22 NOTE — Telephone Encounter (Signed)
Pt returned phone call...contact # [phone].Charm Rings.Erica R Taylor

## 2016-10-22 NOTE — Telephone Encounter (Signed)
Spoke with pt and notified of results per Dr. Isaiah SergeMannam. Pt verbalized understanding and denied any questions. I have cancelled his appt with PM per his request

## 2016-10-22 NOTE — Telephone Encounter (Signed)
LMTCB. Will await call back. 

## 2016-10-22 NOTE — Telephone Encounter (Signed)
I was waiting to have him come in to discuss results. Its ok if he wants them on phone.  Please let him know that it show some minimal changes that may have occurred from previous infection. I don't recommend any treatment right now. There is also small lung nodules that do not require follow up and are likely benign since he is a non smoker.

## 2016-10-23 ENCOUNTER — Ambulatory Visit: Payer: Managed Care, Other (non HMO) | Admitting: Pulmonary Disease

## 2017-05-08 IMAGING — CT CT CHEST HIGH RESOLUTION W/O CM
2 of 6 series · 15 of 36 positions shown, 18 images · non-contrast
Comparison: 08/21/2016 chest radiograph.

CLINICAL DATA: 20-year-old male with dyspnea on exertion and
atypical chest pain.

EXAM:
CT CHEST WITHOUT CONTRAST
TECHNIQUE: Multidetector CT imaging of the chest was performed following the
standard protocol without intravenous contrast. High resolution
imaging of the lungs, as well as inspiratory and expiratory imaging,
was performed.

[Series 4: high resolution · axial · 0.68mm/px · z∈[+1118,+1410]mm · 12 of 164 slices shown, 15 images]
[im 9/164  mediastinal]
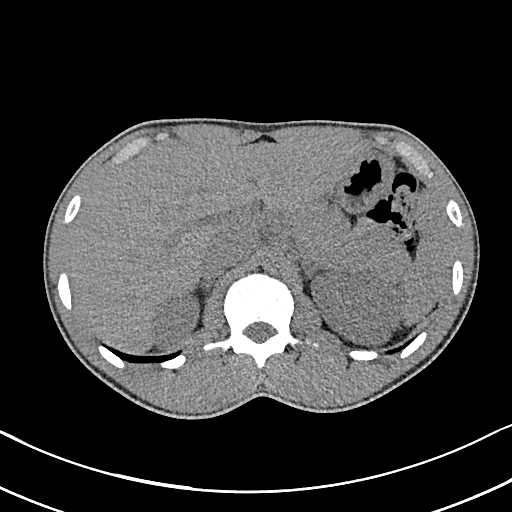
[im 9/164  lung]
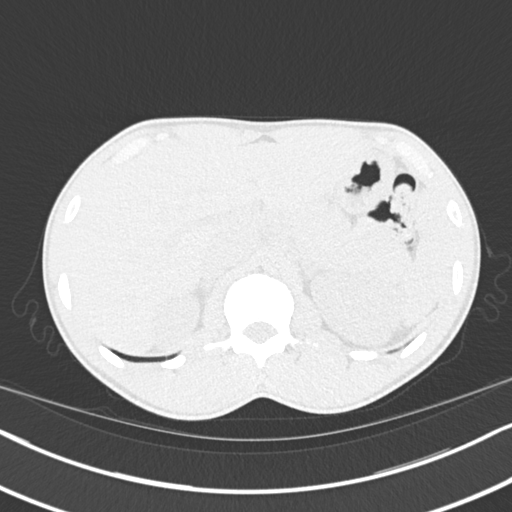
[im 26/164  lung]
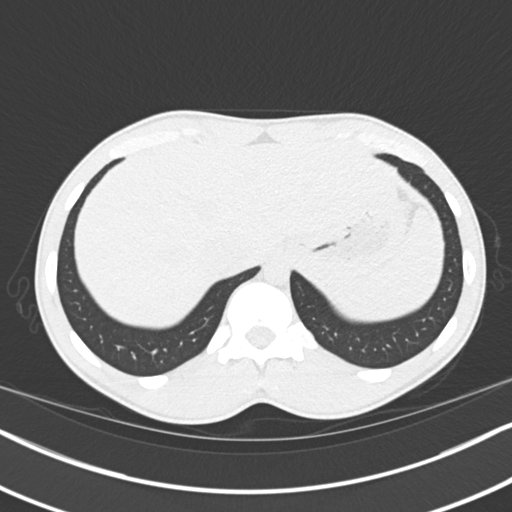
[im 35/164  lung]
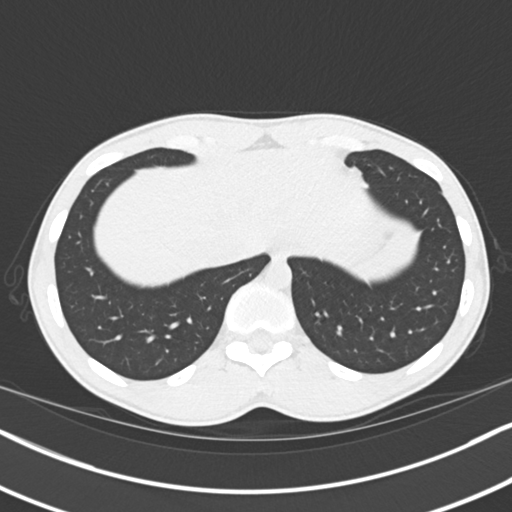
[im 52/164  lung]
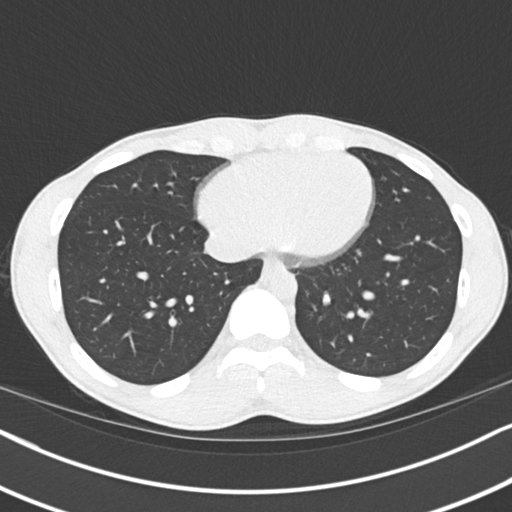
[im 61/164  mediastinal]
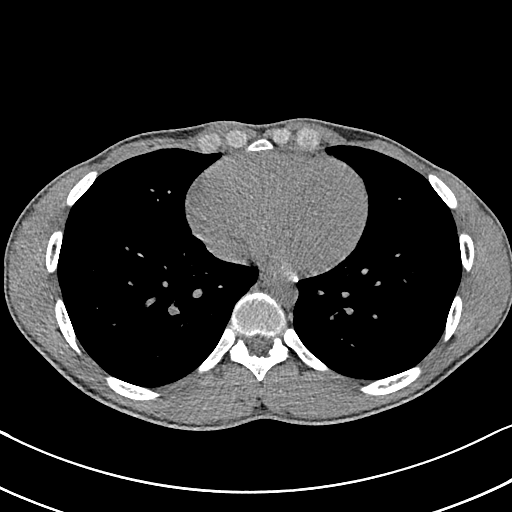
[im 61/164  lung]
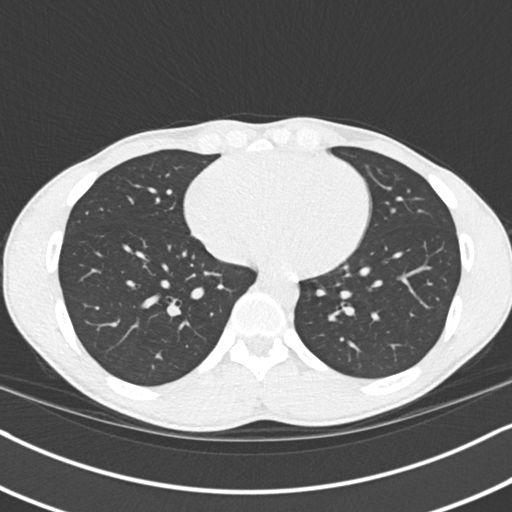
[im 78/164  lung]
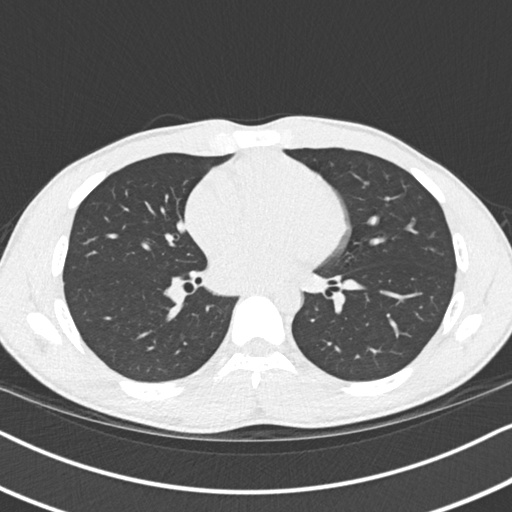
[im 86/164  lung]
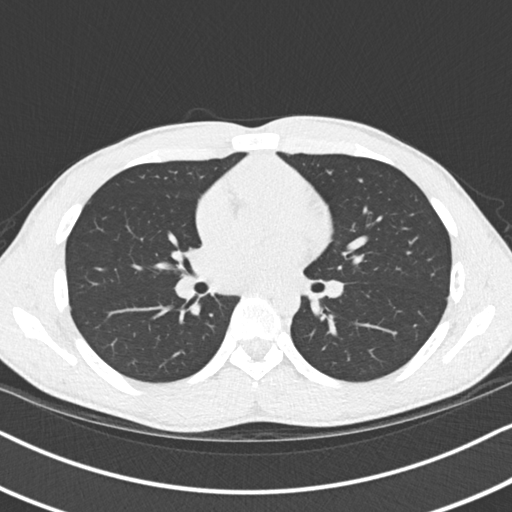
[im 103/164  lung]
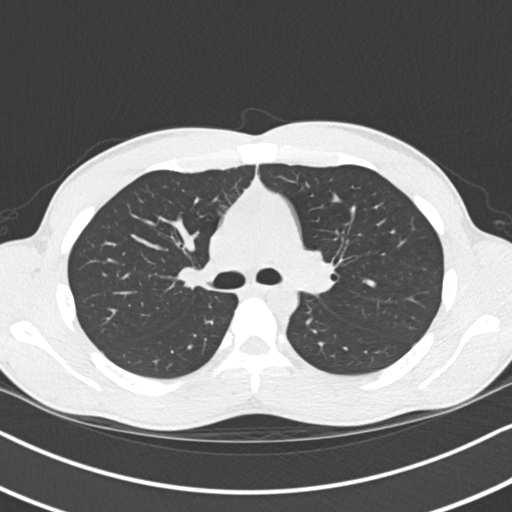
[im 112/164  mediastinal]
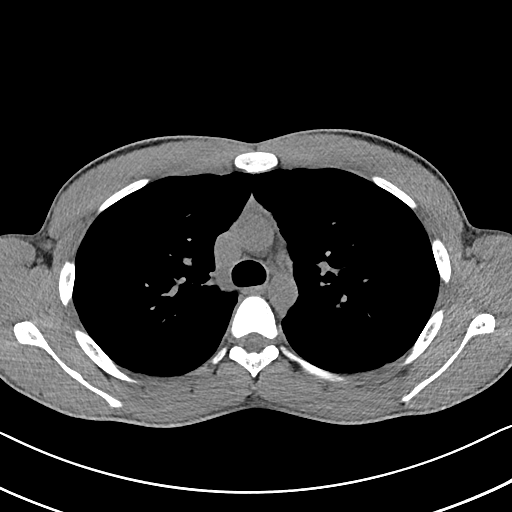
[im 112/164  lung]
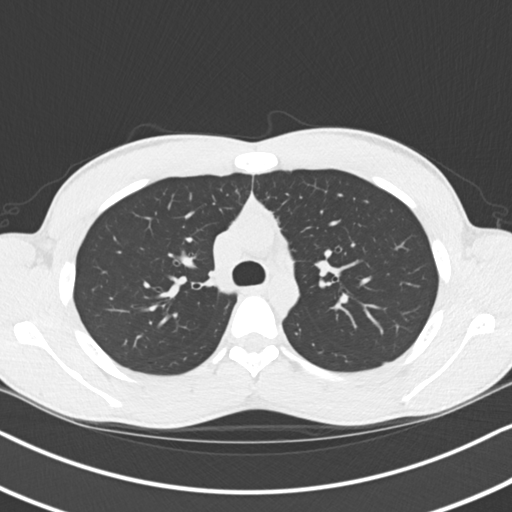
[im 129/164  lung]
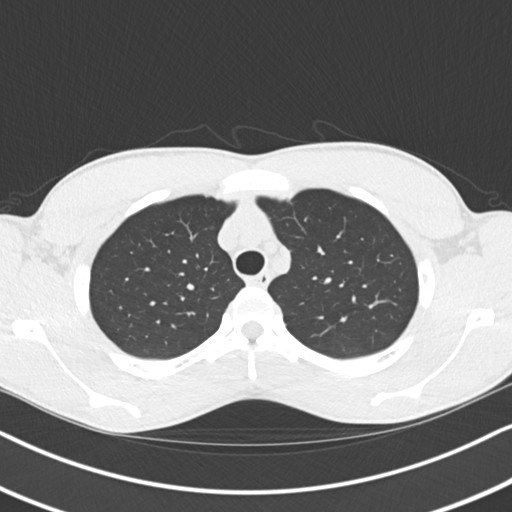
[im 138/164  lung]
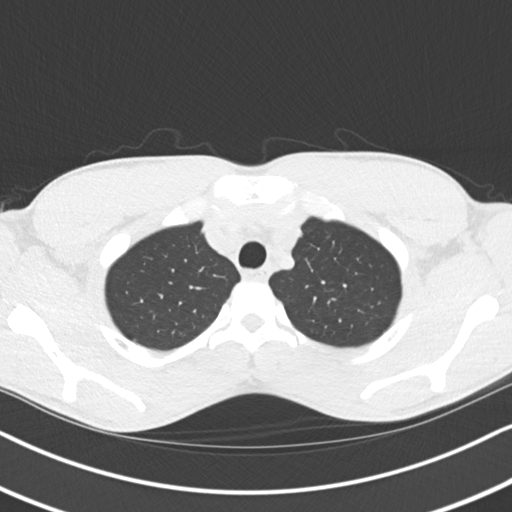
[im 155/164  lung]
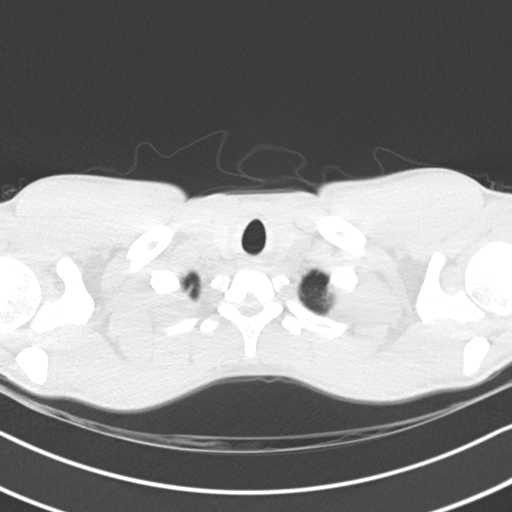

[Series 7: coronal · coronal · 0.62mm/px · 3 of 150 slices shown]
[im 30/150  lung]
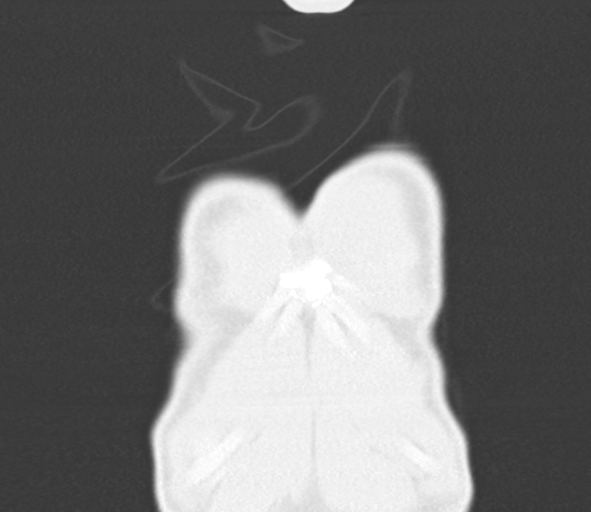
[im 60/150  lung]
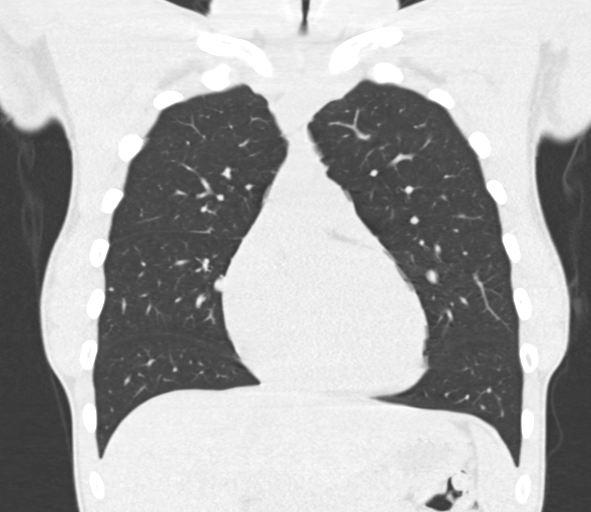
[im 90/150  lung]
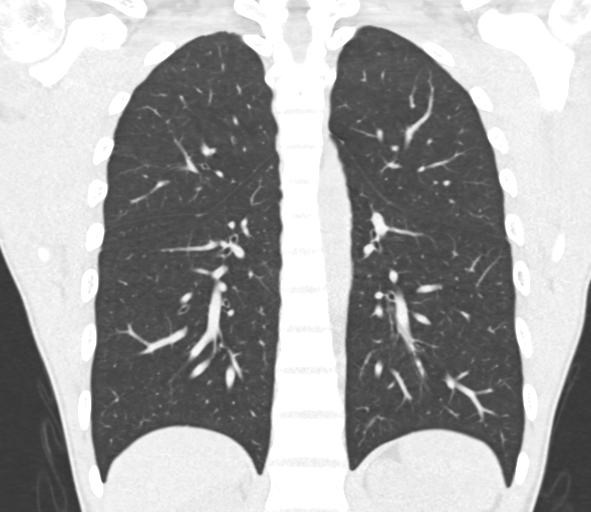

[15 of 36 positions shown; findings below may reference images not displayed]

FINDINGS: Cardiovascular: Normal heart size. No significant pericardial
fluid/thickening. Great vessels are normal in course and caliber.

Mediastinum/Nodes: No discrete thyroid nodules. Unremarkable
esophagus. No pathologically enlarged axillary, mediastinal or gross
hilar lymph nodes, noting limited sensitivity for the detection of
hilar adenopathy on this noncontrast study.

Lungs/Pleura: No pneumothorax. No pleural effusion. No acute
consolidative airspace disease or lung masses. Two solid pulmonary
nodules in the right middle lobe, largest 3 mm (series 5/ image
100). Subpleural anterior left lower lobe 5 mm solid pulmonary
nodule (series 5/image 90). There is minimal patchy tree-in-bud
opacity and subpleural reticulation in the medial upper lobes
bilaterally, with possible minimal associated bronchiolectasis
(series 5/ images 55-58). Otherwise no significant regions of
subpleural reticulation, ground-glass attenuation, traction
bronchiectasis, parenchymal banding, architectural distortion or
frank honeycombing. No significant air trapping on the expiration
sequence.

Upper abdomen: Unremarkable.

Musculoskeletal:  No aggressive appearing focal osseous lesions.
IMPRESSION: 1. Questionable minimal bronchiolectasis in the bilateral medial
upper lobes, with associated minimal patchy tree-in-bud opacity and
subpleural reticulation in the medial upper lobes, suggesting
minimal mucoid impaction and minimal postinflammatory scarring. A
follow-up high-resolution chest CT in 12 months may be useful to
assess the stability of these findings.
2. A few scattered solid pulmonary nodules, largest 5 mm in the
subpleural left lower lobe. No further follow-up is required unless
there are significant risk factors for lung malignancy.
3. No thoracic adenopathy.

## 2017-08-12 ENCOUNTER — Encounter (HOSPITAL_COMMUNITY): Payer: Self-pay | Admitting: *Deleted

## 2017-08-12 ENCOUNTER — Other Ambulatory Visit: Payer: Self-pay | Admitting: Orthopedic Surgery

## 2017-08-12 ENCOUNTER — Ambulatory Visit
Admission: RE | Admit: 2017-08-12 | Discharge: 2017-08-12 | Disposition: A | Discharge status: Home / Self Care | Payer: Managed Care, Other (non HMO) | Source: Ambulatory Visit | Attending: Orthopedic Surgery | Admitting: Orthopedic Surgery

## 2017-08-12 DIAGNOSIS — S62314A Displaced fracture of base of fourth metacarpal bone, right hand, initial encounter for closed fracture: Secondary | ICD-10-CM

## 2017-08-12 NOTE — Progress Notes (Signed)
Spoke with pt for pre-op call. Pt denies cardiac history except for an irregular heart beat a year ago. None recently.

## 2017-08-13 ENCOUNTER — Ambulatory Visit (HOSPITAL_COMMUNITY)
Admission: RE | Admit: 2017-08-13 | Payer: Managed Care, Other (non HMO) | Source: Ambulatory Visit | Admitting: Orthopedic Surgery

## 2017-08-13 SURGERY — OPEN REDUCTION INTERNAL FIXATION (ORIF) METACARPAL
Anesthesia: General | Laterality: Right

## 2022-09-24 DIAGNOSIS — S6991XA Unspecified injury of right wrist, hand and finger(s), initial encounter: Secondary | ICD-10-CM | POA: Diagnosis not present

## 2023-05-28 DIAGNOSIS — Z7251 High risk heterosexual behavior: Secondary | ICD-10-CM | POA: Diagnosis not present

## 2023-05-28 DIAGNOSIS — Z23 Encounter for immunization: Secondary | ICD-10-CM | POA: Diagnosis not present

## 2023-05-28 DIAGNOSIS — Z6825 Body mass index (BMI) 25.0-25.9, adult: Secondary | ICD-10-CM | POA: Diagnosis not present

## 2023-12-20 ENCOUNTER — Other Ambulatory Visit: Payer: Self-pay

## 2023-12-20 ENCOUNTER — Emergency Department (HOSPITAL_COMMUNITY)
Admission: EM | Admit: 2023-12-20 | Discharge: 2023-12-20 | Disposition: A | Discharge status: Home / Self Care | Payer: BC Managed Care – PPO | Attending: Emergency Medicine | Admitting: Emergency Medicine

## 2023-12-20 DIAGNOSIS — I10 Essential (primary) hypertension: Secondary | ICD-10-CM | POA: Diagnosis not present

## 2023-12-20 DIAGNOSIS — R1111 Vomiting without nausea: Secondary | ICD-10-CM | POA: Diagnosis not present

## 2023-12-20 DIAGNOSIS — Z20822 Contact with and (suspected) exposure to covid-19: Secondary | ICD-10-CM | POA: Insufficient documentation

## 2023-12-20 DIAGNOSIS — R000 Tachycardia, unspecified: Secondary | ICD-10-CM | POA: Diagnosis not present

## 2023-12-20 DIAGNOSIS — R1110 Vomiting, unspecified: Secondary | ICD-10-CM | POA: Diagnosis not present

## 2023-12-20 DIAGNOSIS — K1379 Other lesions of oral mucosa: Secondary | ICD-10-CM | POA: Diagnosis not present

## 2023-12-20 LAB — RESP PANEL BY RT-PCR (RSV, FLU A&B, COVID)  RVPGX2
Influenza A by PCR: NEGATIVE
Influenza B by PCR: NEGATIVE
Resp Syncytial Virus by PCR: NEGATIVE
SARS Coronavirus 2 by RT PCR: NEGATIVE

## 2023-12-20 MED ORDER — DEXAMETHASONE SODIUM PHOSPHATE 10 MG/ML IJ SOLN
10.0000 mg | Freq: Once | INTRAMUSCULAR | Status: AC
  Administered 2023-12-20: 10 mg via INTRAVENOUS
  Filled 2023-12-20: qty 1

## 2023-12-20 MED ORDER — ONDANSETRON HCL 4 MG/2ML IJ SOLN
4.0000 mg | Freq: Once | INTRAMUSCULAR | Status: AC
  Administered 2023-12-20: 4 mg via INTRAVENOUS
  Filled 2023-12-20: qty 2

## 2023-12-20 MED ORDER — DIPHENHYDRAMINE HCL 50 MG/ML IJ SOLN
25.0000 mg | Freq: Once | INTRAMUSCULAR | Status: AC
  Administered 2023-12-20: 25 mg via INTRAVENOUS
  Filled 2023-12-20: qty 1

## 2023-12-20 MED ORDER — DEXAMETHASONE 6 MG PO TABS: 6.0000 mg | ORAL_TABLET | Freq: Once | ORAL | 0 refills | Status: AC

## 2023-12-20 MED ORDER — ONDANSETRON HCL 4 MG PO TABS: 4.0000 mg | ORAL_TABLET | Freq: Four times a day (QID) | ORAL | 0 refills | Status: AC

## 2023-12-20 NOTE — ED Triage Notes (Signed)
 Pt BIB [facility] coming from home. States that he woke up around 530 this morning and felt like something was stuck in his throat. Has been having n/v. Denies any Shob, no pain, states does not feel like throat is closing, Only discomfort. States vomiting only bile nothing coming up.  BP 136/72, HR 110, Spo2 99% RA

## 2023-12-20 NOTE — ED Notes (Signed)
 Pt was able to tolerate drinking water without vomiting.

## 2023-12-20 NOTE — Discharge Instructions (Addendum)
 You were seen in the emergency department for swelling in your throat.  Your uvula which is the part that dangles in the back of your throat is swollen.  This is commonly caused by viral infection.  We did test you today for flu,, COVID and RSV and you can follow-up your results online on your patient portal.  I have given you a second dose of steroids that you should take in 24 hours and you can use Zofran  as needed for nausea.  You should follow-up with your primary doctor in the next few days to have your symptoms rechecked.  You should return to the emergency department if you have increased swelling and you are unable to swallow even your own saliva, you are unable to open your mouth or if you have any other new or concerning symptoms.

## 2023-12-20 NOTE — ED Provider Notes (Signed)
 [place] EMERGENCY DEPARTMENT AT [facility] Provider Note   CSN: [id number] Arrival date & time: 12/20/23  9392     History  Chief Complaint  Patient presents with   Nausea        Emesis    Jeffery Mendoza is a 28 y.o. male.  HPI     This is a 28 year old male who presents with vomiting and feeling like something is stuck in his throat.  He states he woke up this morning feeling this way.  He went to bed normally last night.  He ate chicken for dinner but did not feel like anything stuck at that time.  Has not had any diarrhea or known sick contacts.  Reports discomfort and foreign body sensation in the throat but not like his throat is swelling.  Denies any new medications or foods that he could have reacted to.  Has not otherwise been ill or febrile.  He has not tried to drink anything.  Home Medications Prior to Admission medications   Medication Sig Start Date End Date Taking? Authorizing Provider  Azelastine -Fluticasone  (DYMISTA ) 137-50 MCG/ACT SUSP Place 1 spray into the nose 2 (two) times daily. 09/12/16   Mannam, Praveen, MD  chlorpheniramine  (CHLORPHEN) 4 MG tablet Take 1 tablet (4 mg total) by mouth 2 (two) times daily as needed for allergies. 09/16/16   Mannam, Praveen, MD  diphenhydrAMINE  (BENADRYL ) 25 MG tablet Take 2 tablets (50 mg total) by mouth every 4 (four) hours as needed for itching. 07/07/16   Haze Lonni PARAS, MD  EPIPEN 2-PAK 0.3 MG/0.3ML SOAJ injection Use as needed for anaphylactic reaction 08/22/16   [provider]  ibuprofen (ADVIL,MOTRIN) 800 MG tablet Take 800 mg by mouth every 8 (eight) hours as needed. Pt states he's taking 1 a day    [provider]  oxyCODONE-acetaminophen (PERCOCET/ROXICET) 5-325 MG tablet Take 1-2 tablets by mouth every 4 (four) hours as needed for severe pain.    [provider]  RABEprazole  (ACIPHEX ) 20 MG tablet Take 1 tablet (20 mg total) by mouth daily. 09/19/16   Jeneal Danita Macintosh, MD  VENTOLIN  HFA 108 [phone] Base) MCG/ACT inhaler Inhale 2 puffs into the lungs every 6 (six) hours as needed. 08/26/16   [provider]      Allergies    Patient has no known allergies.    Review of Systems   Review of Systems  Constitutional:  Negative for fever.  Respiratory:  Negative for shortness of breath.   Cardiovascular:  Negative for chest pain.  Gastrointestinal:  Positive for nausea and vomiting. Negative for abdominal pain and diarrhea.  All other systems reviewed and are negative.   Physical Exam Updated Vital Signs BP (!) 148/89 (BP Location: Left Arm)   Pulse 92   Temp 97.9 F (36.6 C) (Oral)   Resp 18   Ht 1.93 m (6' 4)   Wt 90.7 kg   SpO2 100%   BMI 24.34 kg/m  Physical Exam Vitals and nursing note reviewed.  Constitutional:      Appearance: He is well-developed. He is not ill-appearing.  HENT:     Head: Normocephalic and atraumatic.     Mouth/Throat:     Mouth: Mucous membranes are moist.     Comments: Diffuse uvula edema, uvula midline No trismus, speaking however with his mouth partially open Eyes:     Pupils: Pupils are equal, round, and reactive to light.  Cardiovascular:     Rate and Rhythm: Normal  rate and regular rhythm.     Heart sounds: Normal heart sounds. No murmur heard. Pulmonary:     Effort: Pulmonary effort is normal. No respiratory distress.     Breath sounds: Normal breath sounds. No wheezing.  Abdominal:     Palpations: Abdomen is soft.     Tenderness: There is no abdominal tenderness. There is no rebound.  Musculoskeletal:     Cervical back: Neck supple.  Lymphadenopathy:     Cervical: No cervical adenopathy.  Skin:    General: Skin is warm and dry.  Neurological:     Mental Status: He is alert and oriented to person, place, and time.  Psychiatric:        Mood and Affect: Mood normal.     ED Results / Procedures / Treatments   Labs (all labs ordered are listed, but only abnormal results are  displayed) Labs Reviewed  RESP PANEL BY RT-PCR (RSV, FLU A&B, COVID)  [id number]    EKG None  Radiology No results found.  Procedures Procedures    Medications Ordered in ED Medications  dexamethasone  (DECADRON ) injection 10 mg (has no administration in time range)  diphenhydrAMINE  (BENADRYL ) injection 25 mg (has no administration in time range)  ondansetron  (ZOFRAN ) injection 4 mg (has no administration in time range)    ED Course/ Medical Decision Making/ A&P                                 Medical Decision Making Risk Prescription drug management.   This patient presents to the ED for concern of vomiting, foreign body sensation in throat, this involves an extensive number of treatment options, and is a complaint that carries with it a high risk of complications and morbidity.  I considered the following differential and admission for this acute, potentially life threatening condition.  The differential diagnosis includes food impaction, allergic reaction, viral illness, uvulitis  MDM:    This is a 28 year old male who presents with vomiting.  Also reports foreign body sensation in the throat.  On clinical exam he is nontoxic.  He is frequently spitting into an emesis basin and is speaking with his mouth partially open but no trismus.  He has diffuse uvula edema.  This is midline.  He denies any specific culprits for possible allergic reaction.  Suspect his foreign body sensation is actually his uvula.  Regarding his nausea and vomiting, this may be him attempting to clear his secretions.  Patient was given Zofran , Decadron , Benadryl  for any possible allergic response.  Will have him attempt to p.o. challenge.  COVID and influenza testing pending  (Labs, imaging, consults)  Labs: I Ordered, and personally interpreted labs.  The pertinent results include: COVID and influenza  Imaging Studies ordered: I ordered imaging studies including none I independently visualized and  interpreted imaging. I agree with the radiologist interpretation  Additional history obtained from chart review.  External records from outside source obtained and reviewed including prior evaluations  Cardiac Monitoring: The patient was maintained on a cardiac monitor.  If on the cardiac monitor, I personally viewed and interpreted the cardiac monitored which showed an underlying rhythm of: Sinus  Reevaluation: After the interventions noted above, I reevaluated the patient and found that they have :stayed the same  Social Determinants of Health:  lives independently  Disposition: Signed out to oncoming provider  Co morbidities that complicate the patient evaluation  Past Medical History:  Diagnosis  Date   Abnormal EKG    cardiac MRI 08/05/2016: normal cardiac MR with mild LVH, LVEF 60%, trace MR and TR.  normal aortic root and ascending aorta.  milkdly dilated IVC with respiratory variation   Atypical chest pain    Dyspnea on exertion    echocardiogram 07/18/2016: left ventricle cavity is normal in size.  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  there is possible milkd speckled pattern apparents and may be a normal variant.  low normal LV systolic function.  visual EF is 50-55%.  calculated EF 50%.  structurally normal mitral valve with trace regurgitation.  structurally normal tricuspid valve with trace regurgitation.   Palpitations      Medicines Meds ordered this encounter  Medications   dexamethasone  (DECADRON ) injection 10 mg   diphenhydrAMINE  (BENADRYL ) injection 25 mg   ondansetron  (ZOFRAN ) injection 4 mg    I have reviewed the patients home medicines and have made adjustments as needed  Problem List / ED Course: Problem List Items Addressed This Visit   None Visit Diagnoses       Uvular swelling    -  Primary                   Final Clinical Impression(s) / ED Diagnoses Final diagnoses:  Uvular swelling    Rx / DC Orders ED Discharge  Orders     None         Bari Charmaine FALCON, MD 12/20/23 [phone]

## 2023-12-20 NOTE — ED Provider Notes (Signed)
 Patient signed out to me at 07 100 by Dr. Bari pending reassessment.  In short this is a 28 year old male with no significant past medical history presenting to the emergency department this morning with vomiting and feeling foreign body sensation in his throat.  He was found to have uvular swelling consistent with uvulitis, no trismus.  He was given Decadron  and Benadryl  and Zofran .  Upon my evaluation, the patient states that he feels unchanged but has been able to tolerate p.o.  He does have uvular swelling, no trismus, tolerating secretions, normal phonation.  Viral swab is pending.  Patient is stable for discharge home and will be given prescription for Decadron  and recommended close primary care follow-up.   Kingsley, Eternity Dexter K, DO 12/20/23 [phone]
